# Patient Record
Sex: Male | Born: 1980
Health system: Southern US, Community
[De-identification: ages and names within clinical notes are randomized; demographics above are authoritative.]

## PROBLEM LIST (undated history)

## (undated) DIAGNOSIS — R42 Dizziness and giddiness: Secondary | ICD-10-CM

## (undated) DIAGNOSIS — I10 Essential (primary) hypertension: Secondary | ICD-10-CM

## (undated) HISTORY — PX: EYE SURGERY: SHX253

---

## 2019-09-23 ENCOUNTER — Emergency Department (HOSPITAL_BASED_OUTPATIENT_CLINIC_OR_DEPARTMENT_OTHER): Payer: Self-pay

## 2019-09-23 ENCOUNTER — Emergency Department: Payer: Self-pay

## 2019-09-23 ENCOUNTER — Other Ambulatory Visit: Payer: Self-pay

## 2019-09-23 ENCOUNTER — Encounter (HOSPITAL_BASED_OUTPATIENT_CLINIC_OR_DEPARTMENT_OTHER): Payer: Self-pay | Admitting: *Deleted

## 2019-09-23 ENCOUNTER — Emergency Department (HOSPITAL_BASED_OUTPATIENT_CLINIC_OR_DEPARTMENT_OTHER)
Admission: EM | Admit: 2019-09-23 | Discharge: 2019-09-23 | Disposition: A | Discharge status: Home / Self Care | Payer: Self-pay | Attending: Emergency Medicine | Admitting: Emergency Medicine

## 2019-09-23 DIAGNOSIS — R197 Diarrhea, unspecified: Secondary | ICD-10-CM | POA: Insufficient documentation

## 2019-09-23 DIAGNOSIS — Z20822 Contact with and (suspected) exposure to covid-19: Secondary | ICD-10-CM | POA: Insufficient documentation

## 2019-09-23 DIAGNOSIS — R7989 Other specified abnormal findings of blood chemistry: Secondary | ICD-10-CM

## 2019-09-23 DIAGNOSIS — R945 Abnormal results of liver function studies: Secondary | ICD-10-CM | POA: Insufficient documentation

## 2019-09-23 DIAGNOSIS — Z79899 Other long term (current) drug therapy: Secondary | ICD-10-CM | POA: Insufficient documentation

## 2019-09-23 DIAGNOSIS — R0789 Other chest pain: Secondary | ICD-10-CM | POA: Insufficient documentation

## 2019-09-23 DIAGNOSIS — R109 Unspecified abdominal pain: Secondary | ICD-10-CM

## 2019-09-23 DIAGNOSIS — I10 Essential (primary) hypertension: Secondary | ICD-10-CM | POA: Insufficient documentation

## 2019-09-23 DIAGNOSIS — F17210 Nicotine dependence, cigarettes, uncomplicated: Secondary | ICD-10-CM | POA: Insufficient documentation

## 2019-09-23 DIAGNOSIS — R112 Nausea with vomiting, unspecified: Secondary | ICD-10-CM | POA: Insufficient documentation

## 2019-09-23 HISTORY — DX: Essential (primary) hypertension: I10

## 2019-09-23 HISTORY — DX: Dizziness and giddiness: R42

## 2019-09-23 LAB — CBC WITH DIFFERENTIAL/PLATELET
Abs Immature Granulocytes: 0.1 10*3/uL — ABNORMAL HIGH (ref 0.00–0.07)
Basophils Absolute: 0.1 10*3/uL (ref 0.0–0.1)
Basophils Relative: 1 %
Eosinophils Absolute: 0.3 10*3/uL (ref 0.0–0.5)
Eosinophils Relative: 3 %
HCT: 42.3 % (ref 39.0–52.0)
Hemoglobin: 15 g/dL (ref 13.0–17.0)
Immature Granulocytes: 1 %
Lymphocytes Relative: 31 %
Lymphs Abs: 2.9 10*3/uL (ref 0.7–4.0)
MCH: 31.2 pg (ref 26.0–34.0)
MCHC: 35.5 g/dL (ref 30.0–36.0)
MCV: 87.9 fL (ref 80.0–100.0)
Monocytes Absolute: 0.7 10*3/uL (ref 0.1–1.0)
Monocytes Relative: 8 %
Neutro Abs: 5.2 10*3/uL (ref 1.7–7.7)
Neutrophils Relative %: 56 %
Platelets: 332 10*3/uL (ref 150–400)
RBC: 4.81 MIL/uL (ref 4.22–5.81)
RDW: 12.9 % (ref 11.5–15.5)
WBC: 9.3 10*3/uL (ref 4.0–10.5)
nRBC: 0 % (ref 0.0–0.2)

## 2019-09-23 LAB — URINALYSIS, ROUTINE W REFLEX MICROSCOPIC
Bilirubin Urine: NEGATIVE
Glucose, UA: NEGATIVE mg/dL
Hgb urine dipstick: NEGATIVE
Ketones, ur: NEGATIVE mg/dL
Leukocytes,Ua: NEGATIVE
Nitrite: NEGATIVE
Protein, ur: NEGATIVE mg/dL
Specific Gravity, Urine: 1.02 (ref 1.005–1.030)
pH: 7 (ref 5.0–8.0)

## 2019-09-23 LAB — COMPREHENSIVE METABOLIC PANEL
ALT: 216 U/L — ABNORMAL HIGH (ref 0–44)
AST: 109 U/L — ABNORMAL HIGH (ref 15–41)
Albumin: 4.1 g/dL (ref 3.5–5.0)
Alkaline Phosphatase: 72 U/L (ref 38–126)
Anion gap: 9 (ref 5–15)
BUN: 9 mg/dL (ref 6–20)
CO2: 24 mmol/L (ref 22–32)
Calcium: 9.4 mg/dL (ref 8.9–10.3)
Chloride: 101 mmol/L (ref 98–111)
Creatinine, Ser: 0.54 mg/dL — ABNORMAL LOW (ref 0.61–1.24)
GFR calc Af Amer: >60 mL/min (ref >60)
GFR calc non Af Amer: >60 mL/min (ref >60)
Glucose, Bld: 90 mg/dL (ref 70–99)
Potassium: 3.9 mmol/L (ref 3.5–5.1)
Sodium: 134 mmol/L — ABNORMAL LOW (ref 135–145)
Total Bilirubin: 0.7 mg/dL (ref 0.3–1.2)
Total Protein: 7.1 g/dL (ref 6.5–8.1)

## 2019-09-23 LAB — LIPASE, BLOOD: Lipase: 27 U/L (ref 11–51)

## 2019-09-23 LAB — TROPONIN I (HIGH SENSITIVITY)
Troponin I (High Sensitivity): 5 ng/L (ref <18)
Troponin I (High Sensitivity): 5 ng/L (ref <18)

## 2019-09-23 LAB — C DIFFICILE QUICK SCREEN W PCR REFLEX
C Diff antigen: NEGATIVE
C Diff interpretation: NOT DETECTED
C Diff toxin: NEGATIVE

## 2019-09-23 LAB — HEPATITIS PANEL, ACUTE
HCV Ab: NONREACTIVE
Hep A IgM: NONREACTIVE
Hep B C IgM: NONREACTIVE
Hepatitis B Surface Ag: NONREACTIVE

## 2019-09-23 MED ORDER — ONDANSETRON 8 MG PO TBDP: 8.0000 mg | ORAL_TABLET | Freq: Once | ORAL | Status: DC

## 2019-09-23 MED ORDER — ONDANSETRON HCL 4 MG/2ML IJ SOLN
4.0000 mg | Freq: Once | INTRAMUSCULAR | Status: AC
  Administered 2019-09-23: 4 mg via INTRAVENOUS
  Filled 2019-09-23: qty 2

## 2019-09-23 MED ORDER — ONDANSETRON 4 MG PO TBDP: 4.0000 mg | ORAL_TABLET | Freq: Three times a day (TID) | ORAL | 0 refills | Status: AC | PRN

## 2019-09-23 MED FILL — ONDANSETRON ODT 4 MG TABLET: 4 | 3 days supply | Qty: 8 | Fill #0

## 2019-09-23 NOTE — Discharge Instructions (Addendum)
Take Zofran as needed for nausea/vomiting Please drink plenty of fluids and quarantine for the next 2-3 days while waiting for your COVID results Please follow up with a primary care doctor to have your liver enzymes rechecked  Return if you are worsening

## 2019-09-23 NOTE — ED Provider Notes (Signed)
[facility] Provider Note   CSN: [id number] Arrival date & time: 09/23/19  1124     History Chief Complaint  Patient presents with  . Abdominal Pain  . Emesis  . Diarrhea    Ryan Dunn is a 39 y.o. male with history of hypertension and vertigo who presents with abdominal pain, vomiting, diarrhea.  Patient states for the past 2 weeks he has had intermittent nonspecific abdominal pain which he describes as a twisting or squeezing.  Pain is associated with nausea and vomiting as well as multiple episodes of diarrhea.  After he vomits or has diarrhea his abdominal pain improved.  The pain comes and goes and sometimes he will not have any symptoms for days but then the symptoms come back.  He also describes a lot of stress and anxiety in his life.  He states that he has been dealing with these symptoms for long periods of times and has difficulty with insomnia, panic attacks, sleep paralysis.  The GI symptoms however are new.  He has prescribed blood pressure medication and meclizine but states that he does not have a PCP in the area.  He moved from [place] to [place] couple months ago and just got insurance so he is looking for a PCP.  He denies significant alcohol or Tylenol use.  He states that he drank maybe 6 or 7 beers over the past 2 weeks although was a heavy drinker in the past.  He states he has been diagnosed with pancreatitis in the past but was told that his liver was fine.  Denies any recent antibiotic use.  He denies fever, chills, URI symptoms.  He does have some intermittent chest tightness and shortness of breath in the mornings when he has anxiety.  Typically able get better when he goes outside and breathes in fresh air.  He also recently quit smoking so is unsure if this is related or not.  HPI     Past Medical History:  Diagnosis Date  . Hypertension   . Vertigo     There are no problems to display for this patient.   Past  Surgical History:  Procedure Laterality Date  . EYE SURGERY         No family history on file.  Social History   Tobacco Use  . Smoking status: Current Every Day Smoker  . Smokeless tobacco: Never Used  Substance Use Topics  . Alcohol use: Not Currently  . Drug use: Yes    Types: Marijuana    Home Medications Prior to Admission medications   Medication Sig Start Date End Date Taking? Authorizing Provider  HYDROCHLOROTHIAZIDE PO Take by mouth.   Yes [provider]  Meclizine HCl (MEDI-MECLIZINE PO) Take by mouth.   Yes [provider]    Allergies    Patient has no known allergies.  Review of Systems   Review of Systems  Constitutional: Negative for chills and fever.  Respiratory: Positive for chest tightness and shortness of breath. Negative for cough and wheezing.   Cardiovascular: Negative for chest pain and palpitations.  Gastrointestinal: Positive for abdominal pain, diarrhea, nausea and vomiting.  Genitourinary: Negative for dysuria and frequency.  Psychiatric/Behavioral: Positive for sleep disturbance. The patient is nervous/anxious.   All other systems reviewed and are negative.   Physical Exam Updated Vital Signs BP (!) 158/84   Pulse 66   Temp 98.1 F (36.7 C) (Oral)   Resp 17   Ht 5\' 11"  (1.803  m)   Wt 82.6 kg   SpO2 98%   BMI 25.38 kg/m   Physical Exam Vitals and nursing note reviewed.  Constitutional:      General: He is not in acute distress.    Appearance: He is well-developed. He is not ill-appearing.     Comments: Calm, cooperative.  Well-appearing  HENT:     Head: Normocephalic and atraumatic.     Mouth/Throat:     Comments: Poor dentition Eyes:     General: No scleral icterus.       Right eye: No discharge.        Left eye: No discharge.     Conjunctiva/sclera: Conjunctivae normal.     Pupils: Pupils are equal, round, and reactive to light.  Cardiovascular:     Rate and Rhythm: Normal rate and regular rhythm.    Pulmonary:     Effort: Pulmonary effort is normal. No respiratory distress.     Breath sounds: Normal breath sounds.  Abdominal:     General: Abdomen is flat. Bowel sounds are normal. There is no distension.     Palpations: Abdomen is soft.     Tenderness: There is no abdominal tenderness.  Musculoskeletal:     Cervical back: Normal range of motion.  Skin:    General: Skin is warm and dry.  Neurological:     Mental Status: He is alert and oriented to person, place, and time.  Psychiatric:        Behavior: Behavior normal.     ED Results / Procedures / Treatments   Labs (all labs ordered are listed, but only abnormal results are displayed) Labs Reviewed  CBC WITH DIFFERENTIAL/PLATELET - Abnormal; Notable for the following components:      Result Value   Abs Immature Granulocytes 0.10 (*)    All other components within normal limits  COMPREHENSIVE METABOLIC PANEL - Abnormal; Notable for the following components:   Sodium 134 (*)    Creatinine, Ser 0.54 (*)    AST 109 (*)    ALT 216 (*)    All other components within normal limits  GI PATHOGEN PANEL BY PCR, STOOL  C DIFFICILE QUICK SCREEN W PCR REFLEX  NOVEL CORONAVIRUS, NAA (HOSP ORDER, SEND-OUT TO REF LAB; TAT 18-24 HRS)  LIPASE, BLOOD  URINALYSIS, ROUTINE W REFLEX MICROSCOPIC  HEPATITIS PANEL, ACUTE  TROPONIN I (HIGH SENSITIVITY)  TROPONIN I (HIGH SENSITIVITY)    EKG None  Radiology DG Chest Port 1 View  Result Date: 09/23/2019 CLINICAL DATA:  Chest tightness with vomiting EXAM: PORTABLE CHEST 1 VIEW COMPARISON:  None. FINDINGS: Lungs are clear. Heart size and pulmonary vascularity normal. No adenopathy. No pneumothorax. No bone lesions. IMPRESSION: No abnormality noted. Electronically Signed   By: Bretta Bang III M.D.   On: 09/23/2019 13:37   US Abdomen Limited RUQ  Result Date: 09/23/2019 CLINICAL DATA:  Elevated liver function tests EXAM: ULTRASOUND ABDOMEN LIMITED RIGHT UPPER QUADRANT COMPARISON:  None.  FINDINGS: Gallbladder: No gallstones or wall thickening visualized. No sonographic Murphy sign noted by sonographer. Common bile duct: Diameter: 4 mm Liver: No focal lesion identified. Within normal limits in parenchymal echogenicity. Portal vein is patent on color Doppler imaging with normal direction of blood flow towards the liver. Other: None. IMPRESSION: 1. Unremarkable right upper quadrant ultrasound. Electronically Signed   By: Sharlet Salina M.D.   On: 09/23/2019 16:33    Procedures Procedures (including critical care time)  Medications Ordered in ED Medications  ondansetron (ZOFRAN) injection 4  mg (4 mg Intravenous Given 09/23/19 1350)    ED Course  I have reviewed the triage vital signs and the nursing notes.  Pertinent labs & imaging results that were available during my care of the patient were reviewed by me and considered in my medical decision making (see chart for details).  39 year old male presents with multiple symptoms including chest tightness, shortness of breath, intermittent abdominal pain, intermittent nausea and vomiting and diarrhea.  Blood pressure is mildly elevated here but otherwise vital signs are normal.  Heart is regular rate and rhythm.  Lungs are clear to auscultation.  Abdomen is soft and nontender.  Due to persistent bothersome symptoms will obtain labs, urine, EKG, chest x-ray. Pt does relate a lot of his symptoms might be stress/anxiety induced.  EKG shows sinus rhythm with ST elevation in V2 and V3 which is likely early repol.  We will add on a troponin.  Chest x-ray is negative.  CBC is normal.  CMP is remarkable for abnormal LFTs.  His AST is 109 and ALT is 216.  Lipase and bilirubin are normal.  Troponin is normal.  Urine is clean.  Patient reports no issues with liver in the past and denies IV drug use or significant alcohol intake although states he was a heavy drinker in the past.  Unfortunately I have no value to compare this to.  We will add on  hepatitis panel and right upper quadrant ultrasound due to new onset of abdominal pain, nausea, vomiting, and diarrhea. GI stool pathogen panel and C.diff were collected as well. COVID test added per pt request.  RUQ [place] is negative. Pt feels better after Zofran and is requesting rx for this. Will hold off on tx with anti-diarrheal until C diff testing comes back. Pt was instructed to quarantine until he gets results of his COVID test and to f/u with PCP as soon as he can for recheck of LFTs. He was given return precautions.  Ryan Dunn was evaluated in Emergency Department on 09/23/2019 for the symptoms described in the history of present illness. He was evaluated in the context of the global COVID-19 pandemic, which necessitated consideration that the patient might be at risk for infection with the SARS-CoV-2 virus that causes COVID-19. Institutional protocols and algorithms that pertain to the evaluation of patients at risk for COVID-19 are in a state of rapid change based on information released by regulatory bodies including the CDC and federal and state organizations. These policies and algorithms were followed during the patient's care in the ED.   MDM Rules/Calculators/A&P                       Final Clinical Impression(s) / ED Diagnoses Final diagnoses:  LFT elevation  Abdominal pain, unspecified abdominal location  Nausea vomiting and diarrhea    Rx / DC Orders ED Discharge Orders    None       Bethel Born, PA-C 09/23/19 1733    Little, Ambrose [place], MD 09/24/19 [id number]

## 2019-09-23 NOTE — ED Triage Notes (Signed)
Abdominal pain vomiting and diarrhea. Co workers were tested for [employer].

## 2019-09-24 LAB — NOVEL CORONAVIRUS, NAA (HOSP ORDER, SEND-OUT TO REF LAB; TAT 18-24 HRS): SARS-CoV-2, NAA: NOT DETECTED

## 2019-09-26 LAB — GI PATHOGEN PANEL BY PCR, STOOL

## 2020-03-03 IMAGING — US US ABDOMEN LIMITED
1 series · 14 of 25 positions shown · non-contrast
Comparison: None.

CLINICAL DATA: Elevated liver function tests

EXAM:
ULTRASOUND ABDOMEN LIMITED RIGHT UPPER QUADRANT

[Series 1: us abdomen limited · 14 of 79 slices shown]
[im 1/79]
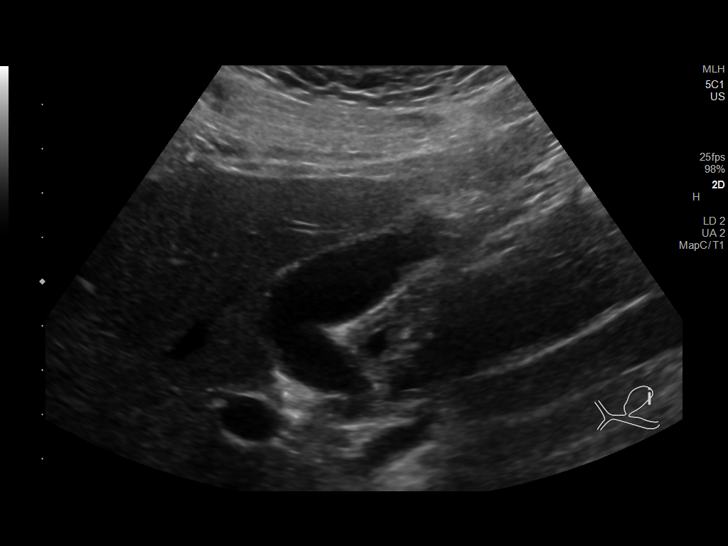
[im 7/79]
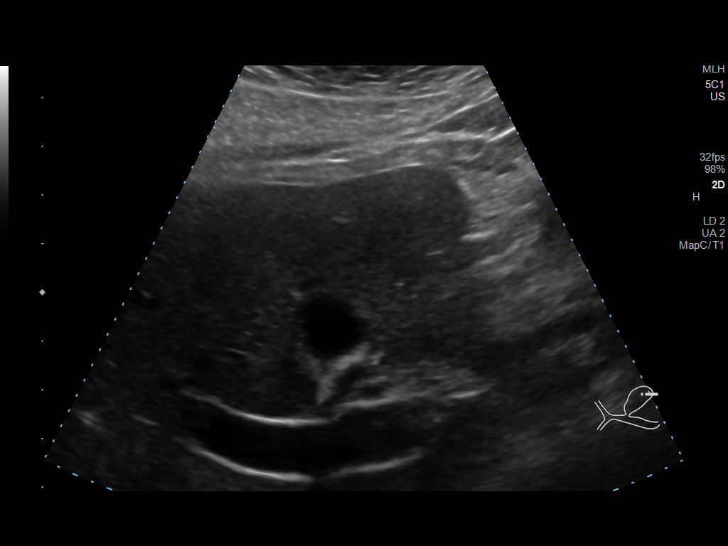
[im 14/79]
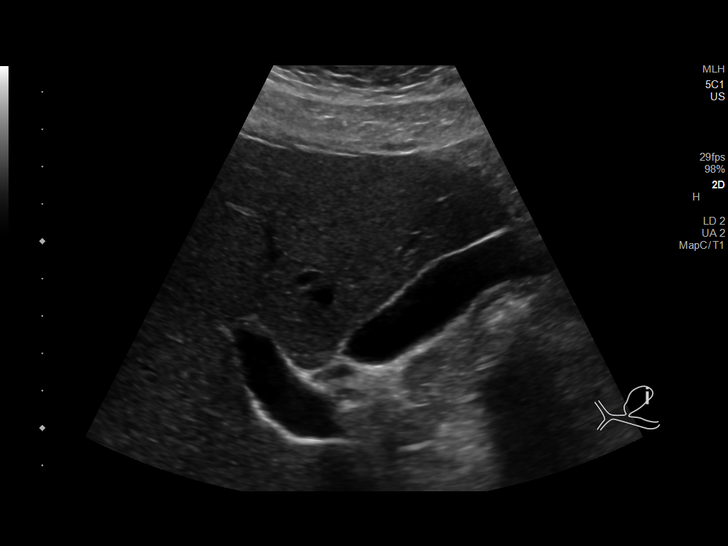
[im 20/79]
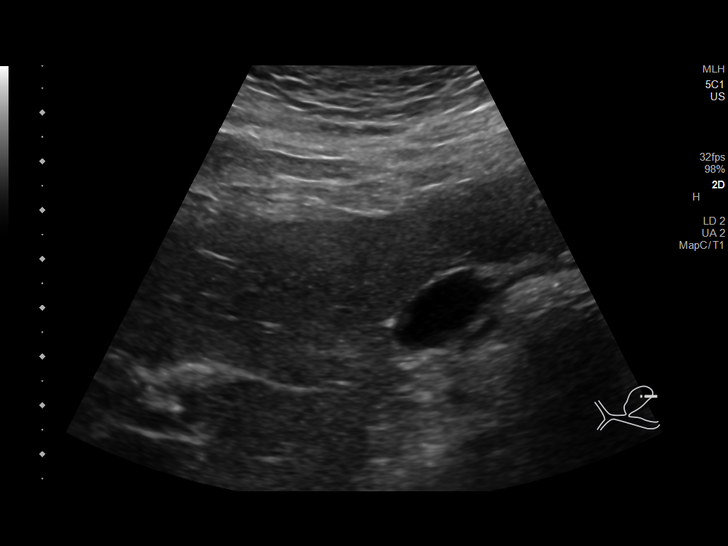
[im 27/79]
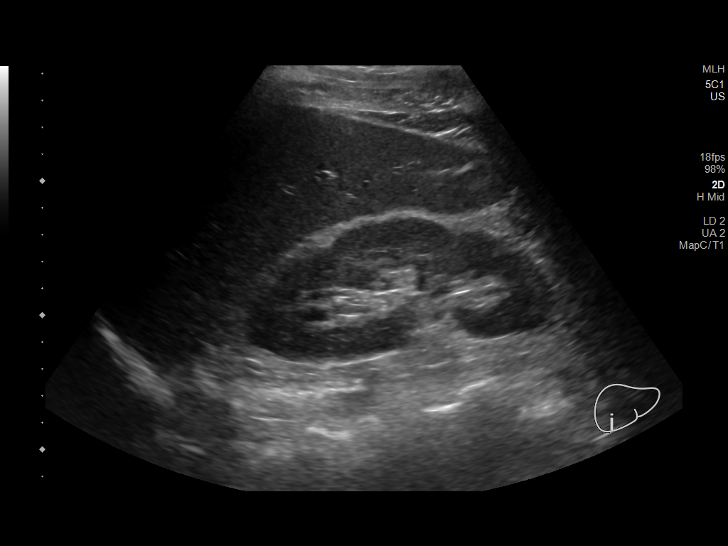
[im 30/79]
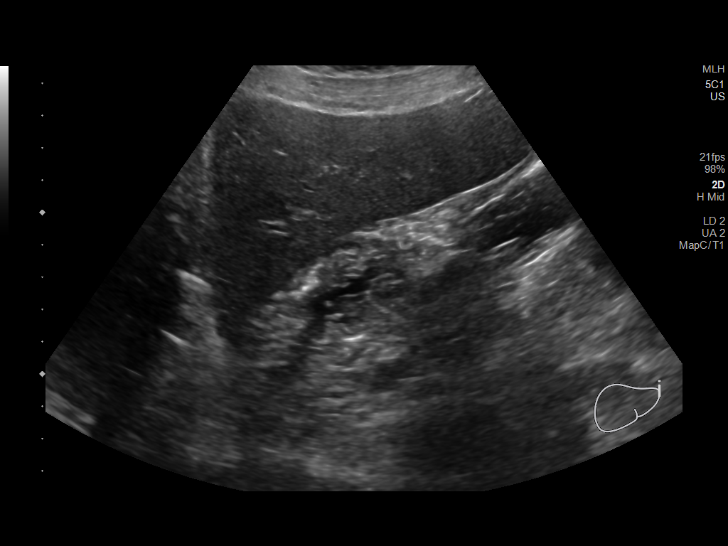
[im 36/79]
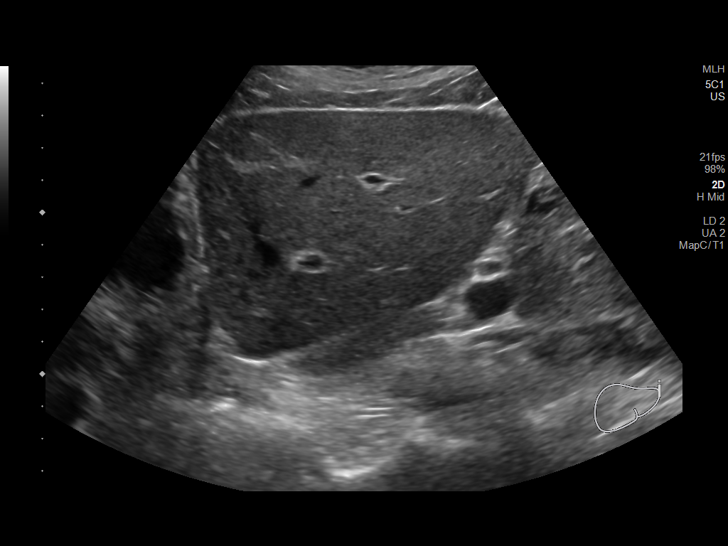
[im 43/79]
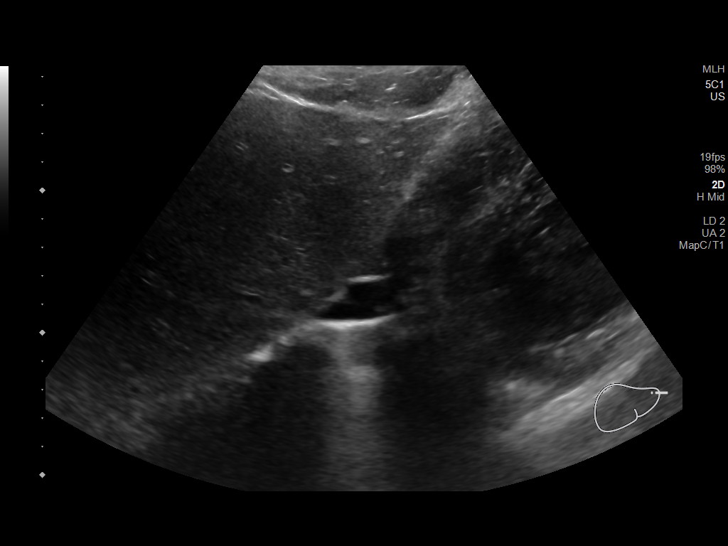
[im 49/79]
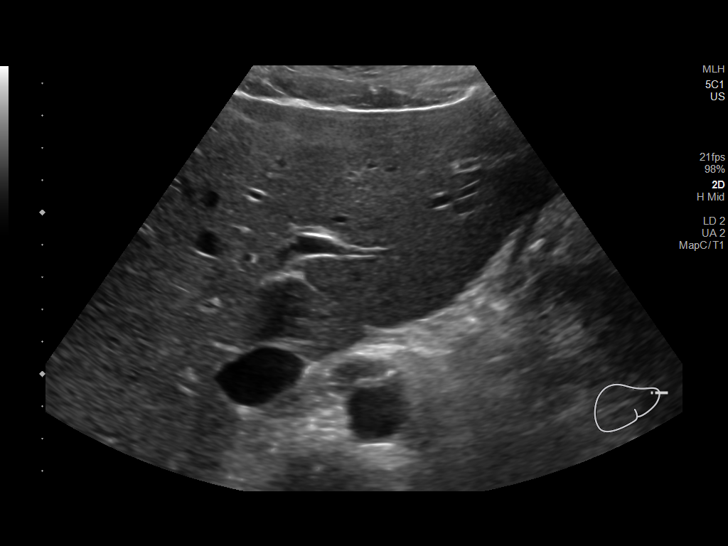
[im 53/79]
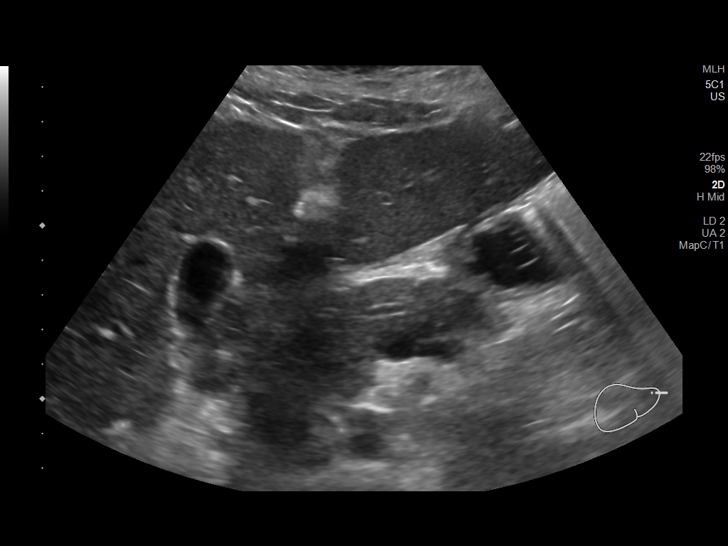
[im 59/79]
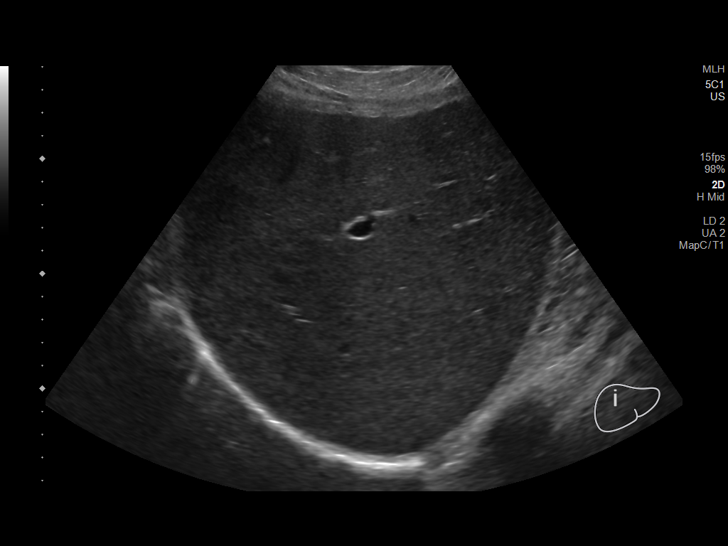
[im 66/79]
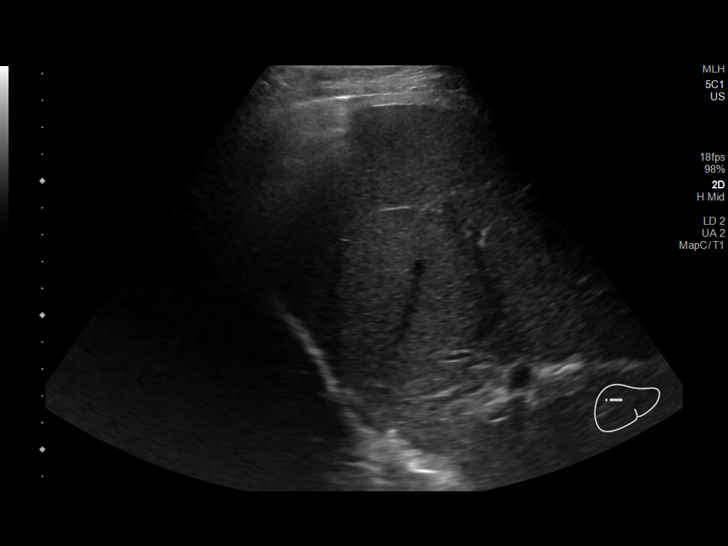
[im 72/79]
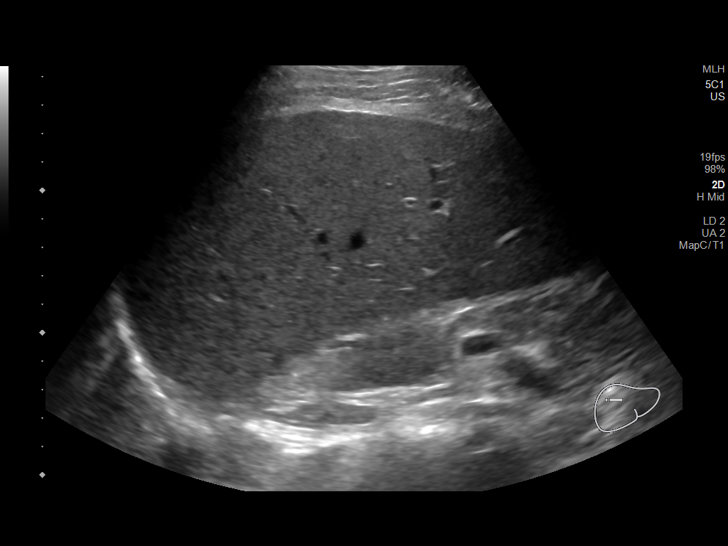
[im 79/79]
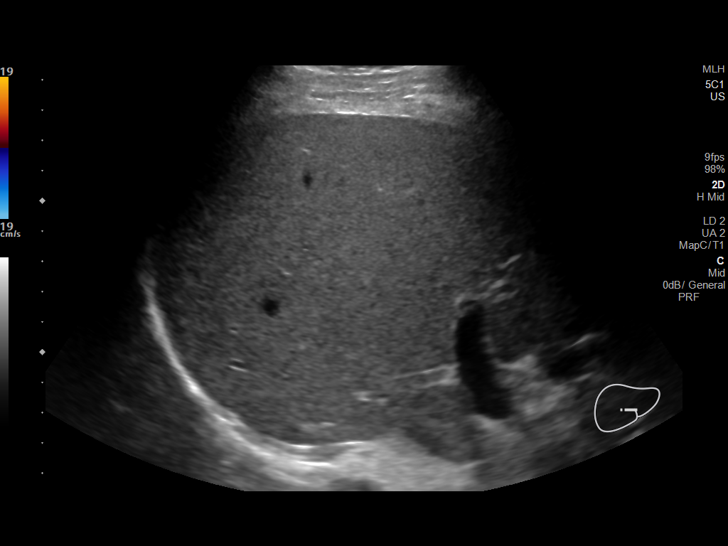

[14 of 25 positions shown; findings below may reference images not displayed]

FINDINGS: Gallbladder:

No gallstones or wall thickening visualized. No sonographic Murphy
sign noted by sonographer.

Common bile duct:

Diameter: 4 mm

Liver:

No focal lesion identified. Within normal limits in parenchymal
echogenicity. Portal vein is patent on color Doppler imaging with
normal direction of blood flow towards the liver.

Other: None.
IMPRESSION: 1. Unremarkable right upper quadrant ultrasound.

## 2020-03-03 IMAGING — DX DG CHEST 1V PORT
2 series · 2 of 2 positions shown · non-contrast
Comparison: None.

CLINICAL DATA: Chest tightness with vomiting

EXAM:
PORTABLE CHEST 1 VIEW

[chest ap (1 of 2)]
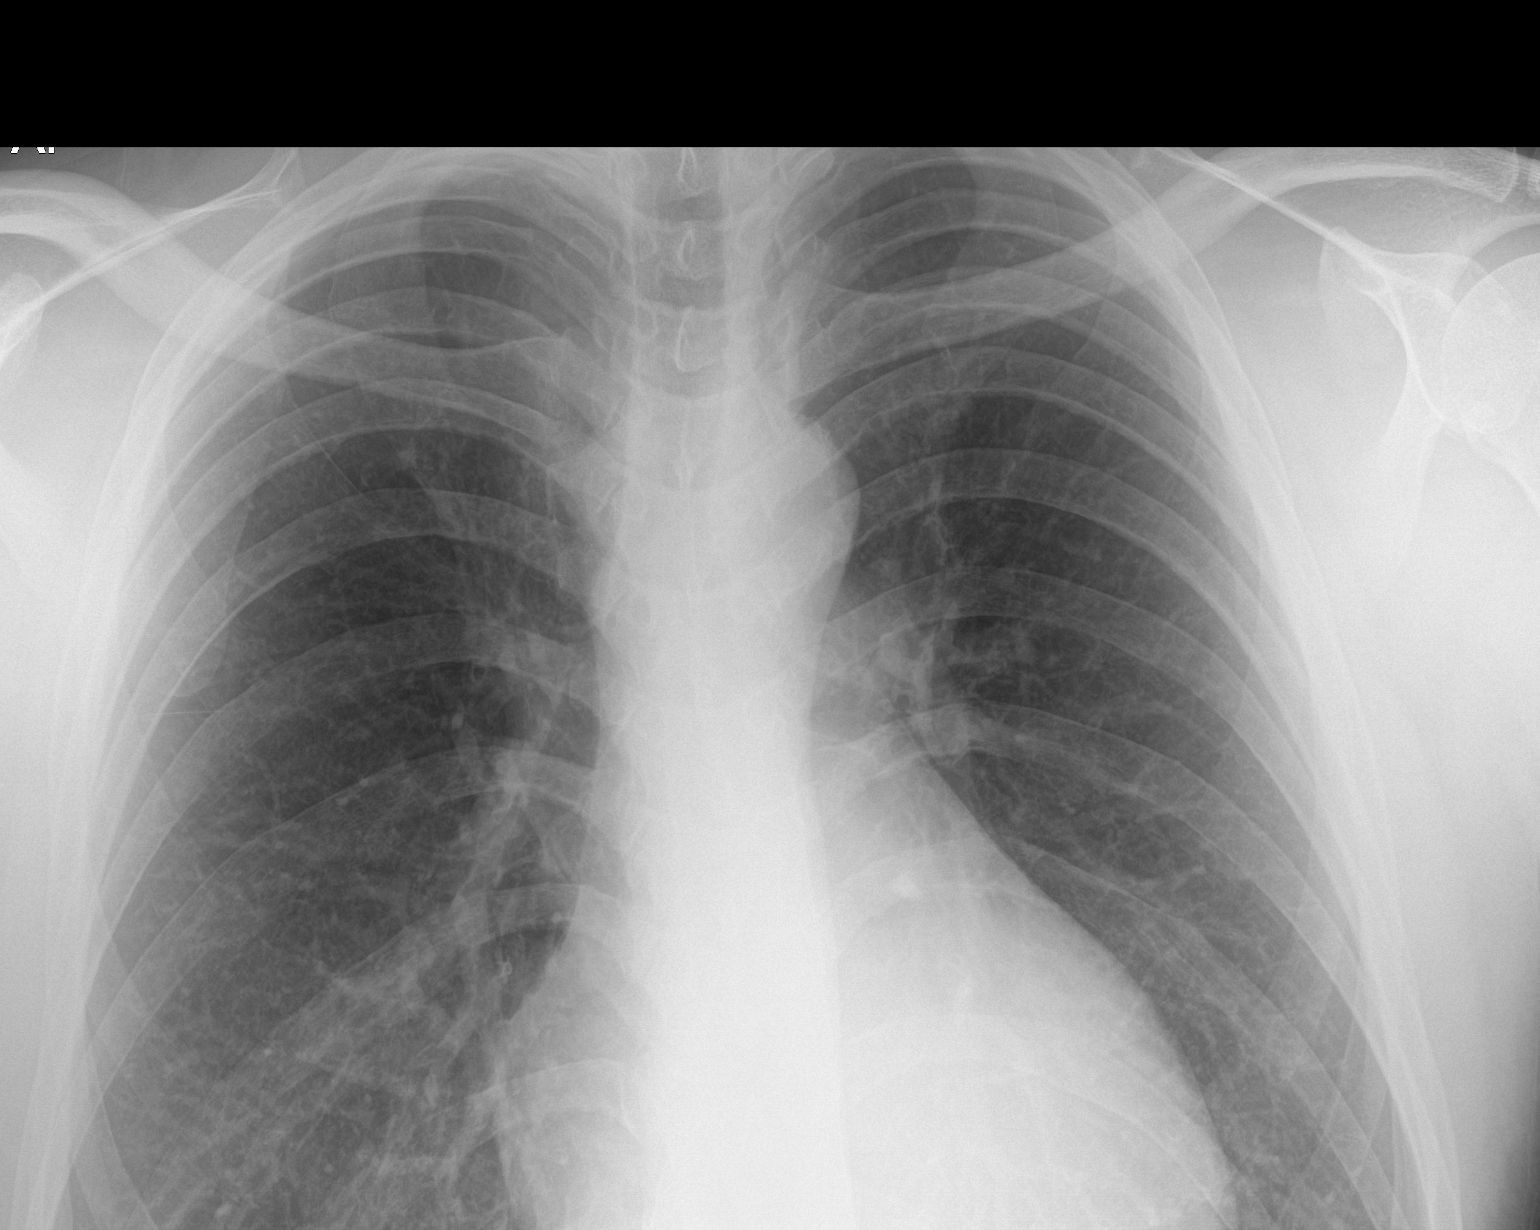

[chest ap (2 of 2)]
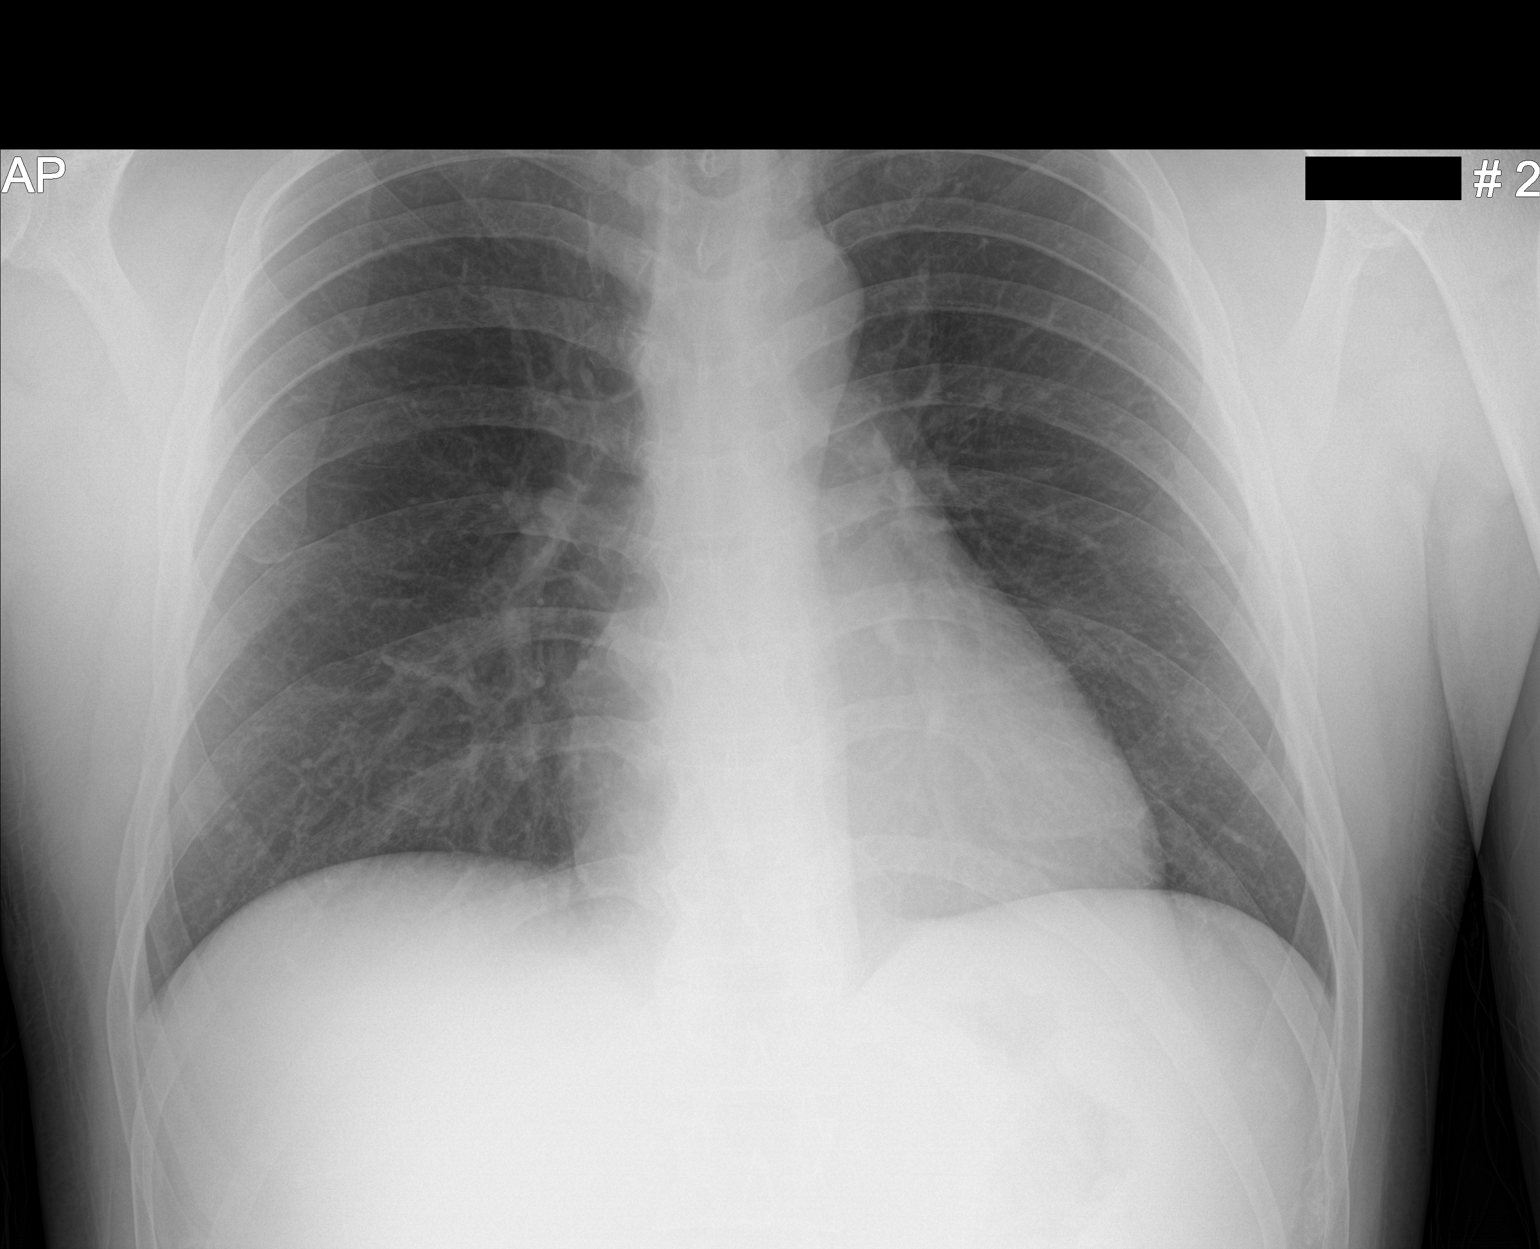

[2 of 2 positions shown; findings below may reference images not displayed]

FINDINGS: Lungs are clear. Heart size and pulmonary vascularity normal. No
adenopathy. No pneumothorax. No bone lesions.
IMPRESSION: No abnormality noted.
